# Patient Record
Sex: Male | Born: 1970 | Race: White | Hispanic: No | Marital: Single | Smoking: Current every day smoker
Health system: Southern US, Community
[De-identification: ages and names within clinical notes are randomized; demographics above are authoritative.]

---

## 2021-06-19 ENCOUNTER — Other Ambulatory Visit (HOSPITAL_COMMUNITY)
Admission: RE | Admit: 2021-06-19 | Discharge: 2021-06-19 | Disposition: A | Discharge status: Home / Self Care | Payer: Medicaid Other | Source: Ambulatory Visit | Attending: Family Medicine | Admitting: Family Medicine

## 2021-06-19 ENCOUNTER — Encounter: Payer: Self-pay | Admitting: Family Medicine

## 2021-06-19 ENCOUNTER — Ambulatory Visit (HOSPITAL_COMMUNITY)
Admission: RE | Admit: 2021-06-19 | Discharge: 2021-06-19 | Disposition: A | Discharge status: Home / Self Care | Payer: Medicaid Other | Source: Ambulatory Visit | Attending: Family Medicine | Admitting: Family Medicine

## 2021-06-19 ENCOUNTER — Ambulatory Visit (INDEPENDENT_AMBULATORY_CARE_PROVIDER_SITE_OTHER): Payer: Medicaid Other | Admitting: Family Medicine

## 2021-06-19 VITALS — BP 138/88 | HR 94 | Ht 71.0 in | Wt 160.0 lb

## 2021-06-19 DIAGNOSIS — Z1159 Encounter for screening for other viral diseases: Secondary | ICD-10-CM | POA: Diagnosis not present

## 2021-06-19 DIAGNOSIS — M7542 Impingement syndrome of left shoulder: Secondary | ICD-10-CM | POA: Insufficient documentation

## 2021-06-19 DIAGNOSIS — M62838 Other muscle spasm: Secondary | ICD-10-CM | POA: Diagnosis not present

## 2021-06-19 DIAGNOSIS — Z114 Encounter for screening for human immunodeficiency virus [HIV]: Secondary | ICD-10-CM

## 2021-06-19 DIAGNOSIS — G4709 Other insomnia: Secondary | ICD-10-CM

## 2021-06-19 DIAGNOSIS — M25512 Pain in left shoulder: Secondary | ICD-10-CM | POA: Diagnosis not present

## 2021-06-19 DIAGNOSIS — G8220 Paraplegia, unspecified: Secondary | ICD-10-CM

## 2021-06-19 DIAGNOSIS — E559 Vitamin D deficiency, unspecified: Secondary | ICD-10-CM | POA: Insufficient documentation

## 2021-06-19 DIAGNOSIS — Z720 Tobacco use: Secondary | ICD-10-CM

## 2021-06-19 DIAGNOSIS — G6289 Other specified polyneuropathies: Secondary | ICD-10-CM

## 2021-06-19 DIAGNOSIS — G8929 Other chronic pain: Secondary | ICD-10-CM

## 2021-06-19 DIAGNOSIS — R7301 Impaired fasting glucose: Secondary | ICD-10-CM

## 2021-06-19 LAB — LIPID PANEL
Cholesterol: 284 mg/dL — ABNORMAL HIGH (ref 0–200)
HDL: 58 mg/dL (ref >40)
LDL Cholesterol: 191 mg/dL — ABNORMAL HIGH (ref 0–99)
Total CHOL/HDL Ratio: 4.9 RATIO
Triglycerides: 177 mg/dL — ABNORMAL HIGH (ref <150)
VLDL: 35 mg/dL (ref 0–40)

## 2021-06-19 LAB — CBC WITH DIFFERENTIAL/PLATELET
Abs Immature Granulocytes: 0.06 10*3/uL (ref 0.00–0.07)
Basophils Absolute: 0.1 10*3/uL (ref 0.0–0.1)
Basophils Relative: 1 %
Eosinophils Absolute: 0.3 10*3/uL (ref 0.0–0.5)
Eosinophils Relative: 3 %
HCT: 46.9 % (ref 39.0–52.0)
Hemoglobin: 15.4 g/dL (ref 13.0–17.0)
Immature Granulocytes: 1 %
Lymphocytes Relative: 23 %
Lymphs Abs: 2.5 10*3/uL (ref 0.7–4.0)
MCH: 29.9 pg (ref 26.0–34.0)
MCHC: 32.8 g/dL (ref 30.0–36.0)
MCV: 91.1 fL (ref 80.0–100.0)
Monocytes Absolute: 0.8 10*3/uL (ref 0.1–1.0)
Monocytes Relative: 8 %
Neutro Abs: 6.9 10*3/uL (ref 1.7–7.7)
Neutrophils Relative %: 64 %
Platelets: 457 10*3/uL — ABNORMAL HIGH (ref 150–400)
RBC: 5.15 MIL/uL (ref 4.22–5.81)
RDW: 13.9 % (ref 11.5–15.5)
WBC: 10.6 10*3/uL — ABNORMAL HIGH (ref 4.0–10.5)
nRBC: 0 % (ref 0.0–0.2)

## 2021-06-19 LAB — COMPREHENSIVE METABOLIC PANEL
ALT: 17 U/L (ref 0–44)
AST: 9 U/L — ABNORMAL LOW (ref 15–41)
Albumin: 4.4 g/dL (ref 3.5–5.0)
Alkaline Phosphatase: 69 U/L (ref 38–126)
Anion gap: 8 (ref 5–15)
BUN: 10 mg/dL (ref 6–20)
CO2: 24 mmol/L (ref 22–32)
Calcium: 9.5 mg/dL (ref 8.9–10.3)
Chloride: 107 mmol/L (ref 98–111)
Creatinine, Ser: 0.74 mg/dL (ref 0.61–1.24)
GFR, Estimated: >60 mL/min (ref >60)
Glucose, Bld: 95 mg/dL (ref 70–99)
Potassium: 3.9 mmol/L (ref 3.5–5.1)
Sodium: 139 mmol/L (ref 135–145)
Total Bilirubin: 0.5 mg/dL (ref 0.3–1.2)
Total Protein: 7.7 g/dL (ref 6.5–8.1)

## 2021-06-19 LAB — HEMOGLOBIN A1C
Hgb A1c MFr Bld: 5.1 % (ref 4.8–5.6)
Mean Plasma Glucose: 99.67 mg/dL

## 2021-06-19 LAB — VITAMIN D 25 HYDROXY (VIT D DEFICIENCY, FRACTURES): Vit D, 25-Hydroxy: 24.98 ng/mL — ABNORMAL LOW (ref 30–100)

## 2021-06-19 LAB — T4, FREE: Free T4: 0.9 ng/dL (ref 0.61–1.12)

## 2021-06-19 LAB — HIV ANTIBODY (ROUTINE TESTING W REFLEX): HIV Screen 4th Generation wRfx: NONREACTIVE

## 2021-06-19 LAB — TSH: TSH: 0.505 u[IU]/mL (ref 0.350–4.500)

## 2021-06-19 LAB — HEPATITIS C ANTIBODY: HCV Ab: NONREACTIVE

## 2021-06-19 MED ORDER — TRAZODONE HCL 50 MG PO TABS: 25.0000 mg | ORAL_TABLET | Freq: Every evening | ORAL | 3 refills | Status: AC | PRN

## 2021-06-19 MED ORDER — GABAPENTIN 300 MG PO CAPS
300.0000 mg | ORAL_CAPSULE | Freq: Three times a day (TID) | ORAL | 2 refills | Status: DC
Start: 2021-06-19 — End: 2021-06-23

## 2021-06-19 MED ORDER — CYCLOBENZAPRINE HCL 5 MG PO TABS: 5.0000 mg | ORAL_TABLET | Freq: Three times a day (TID) | ORAL | 1 refills | Status: DC | PRN

## 2021-06-19 MED ORDER — LIDOCAINE 4 % EX PTCH: MEDICATED_PATCH | CUTANEOUS | 2 refills | Status: AC

## 2021-06-19 NOTE — Patient Instructions (Signed)
I appreciate the opportunity to provide care to you today! ? ?  ?Follow up:  3 months ? ?Labs: please stop by the lab during the week to get your blood drawn (CBC, CMP, TSH, Lipid profile, HgA1c, Vit D) ? ?Screening: HIV and Hep C ? ?Please stop by [facility] hospital anytime to get an x-ray of your left shoulders ? ?Please pick up your medications at the pharmacy ? ?Referrals today-  PT ?  ?Please continue to a heart-healthy diet and increase your physical activities. Try to exercise for 56mins at least three times a week.  ? ? ?  ?It was a pleasure to see you and I look forward to continuing to work together on your health and well-being. ?Please do not hesitate to call the office if you need care or have questions about your care. ?  ?Have a wonderful day and week. ?With Gratitude, ?Alvira Monday MSN, FNP-BC  ?
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
## 2021-06-21 DIAGNOSIS — M25512 Pain in left shoulder: Secondary | ICD-10-CM | POA: Insufficient documentation

## 2021-06-21 DIAGNOSIS — G629 Polyneuropathy, unspecified: Secondary | ICD-10-CM | POA: Insufficient documentation

## 2021-06-21 DIAGNOSIS — G8220 Paraplegia, unspecified: Secondary | ICD-10-CM | POA: Insufficient documentation

## 2021-06-21 DIAGNOSIS — Z720 Tobacco use: Secondary | ICD-10-CM | POA: Insufficient documentation

## 2021-06-21 DIAGNOSIS — G4700 Insomnia, unspecified: Secondary | ICD-10-CM | POA: Insufficient documentation

## 2021-06-21 NOTE — Assessment & Plan Note (Signed)
Referral made to PT to resume treatment ?

## 2021-06-21 NOTE — Assessment & Plan Note (Signed)
Patient reports numbness and tingling in his hands and feet ?Gabapentin ordered ?

## 2021-06-21 NOTE — Assessment & Plan Note (Deleted)
Patient reports numbness and tingling in his hands and feet ?Gabapentin ordered ?

## 2021-06-21 NOTE — Assessment & Plan Note (Signed)
Smokes about 1/2 pack/day  Asked about quitting: confirms that he/she currently smokes cigarettes Advise to quit smoking: Educated about QUITTING to reduce the risk of cancer, cardio and cerebrovascular disease. Assess willingness: Unwilling to quit at this time, but is working on cutting back. Assist with counseling and pharmacotherapy: Counseled for 5 minutes and literature provided. Arrange for follow up: follow up in 3 months and continue to offer help. 

## 2021-06-21 NOTE — Assessment & Plan Note (Addendum)
-  X-ray order to assess for shoulder impingement/ rotator cuff tendinitis ?- Lidocaine patch to help with pain ?-Gabapentin order to help with nerve pain ?
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
## 2021-06-21 NOTE — Assessment & Plan Note (Signed)
-  Patient is wheelchair-bound due to weakness in the lower extremities ?-Referral to PT ?

## 2021-06-22 ENCOUNTER — Encounter: Payer: Self-pay | Admitting: Family Medicine

## 2021-06-22 ENCOUNTER — Telehealth: Payer: Self-pay

## 2021-06-22 ENCOUNTER — Telehealth: Payer: Self-pay | Admitting: Family Medicine

## 2021-06-22 ENCOUNTER — Other Ambulatory Visit: Payer: Self-pay | Admitting: Family Medicine

## 2021-06-22 DIAGNOSIS — E785 Hyperlipidemia, unspecified: Secondary | ICD-10-CM

## 2021-06-22 MED ORDER — ROSUVASTATIN CALCIUM 20 MG PO TABS: 20.0000 mg | ORAL_TABLET | Freq: Every day | ORAL | 3 refills | Status: AC

## 2021-06-22 NOTE — Telephone Encounter (Signed)
I've sent you a message regarding his labs.

## 2021-06-22 NOTE — Telephone Encounter (Signed)
Pt states that the previous rx for gabapentin was 2 pills 3x daily. He states that the new rx for gabapentin is 1 pill, 3x a day. He is unable to pick up the new rx on 07/01/2021. He states that phar was supposed to have called [place]. Can you please look into this? ? ? ? ? ?[facility] [address]  ?

## 2021-06-22 NOTE — Telephone Encounter (Signed)
Spoke to pt went over labs 

## 2021-06-22 NOTE — Progress Notes (Signed)
The 10-year ASCVD risk score (Arnett DK, et al., 2019) is: 13.3% ?  Values used to calculate the score: ?    Age: 51 years ?    Sex: Male ?    Is Non-Hispanic African American: No ?    Diabetic: No ?    Tobacco smoker: Yes ?    Systolic Blood Pressure: 138 mmHg ?    Is BP treated: No ?    HDL Cholesterol: 58 mg/dL ?    Total Cholesterol: 284 mg/dL  ?

## 2021-06-22 NOTE — Telephone Encounter (Signed)
Patient returning lab result call 

## 2021-06-22 NOTE — Progress Notes (Signed)
Please inform the patient that I started him on Rosuvastatin for his elevated cholesterol. He can pick up his prescription at the pharmacy. ? ?I also recommend a cholesterol-lowering diet low in fat or saturated fat because her cholesterol were slightly elevated. I would advise her to implement the Mediterranean diet, which emphasizes fruits, vegetables, whole grains, beans, nuts, seeds, and healthy fats. ? ? His Vit. D is slightly low, I can put in orders for Vit D supplements or he can take OTC Vit D 5000iu daily. ? ?All other labs were normal ?

## 2021-06-22 NOTE — Telephone Encounter (Signed)
Tried calling pt back unable to reach him.  ?

## 2021-06-22 NOTE — Telephone Encounter (Signed)
Pt called back for test results. States his phone is messed up and was unable to answer. Please call back when available.  ?

## 2021-06-22 NOTE — Telephone Encounter (Signed)
Patient calling about lab results. 

## 2021-06-22 NOTE — Telephone Encounter (Signed)
Please advice on lab results?  

## 2021-06-23 ENCOUNTER — Other Ambulatory Visit: Payer: Self-pay | Admitting: Family Medicine

## 2021-06-23 ENCOUNTER — Telehealth: Payer: Self-pay

## 2021-06-23 ENCOUNTER — Telehealth: Payer: Self-pay | Admitting: Family Medicine

## 2021-06-23 MED ORDER — GABAPENTIN 300 MG PO CAPS: 600.0000 mg | ORAL_CAPSULE | Freq: Three times a day (TID) | ORAL | 2 refills | Status: AC
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
## 2021-06-23 NOTE — Telephone Encounter (Signed)
Pt would like to have an MRI on his neck if possible.  ?

## 2021-06-23 NOTE — Telephone Encounter (Signed)
Please advice  

## 2021-06-23 NOTE — Telephone Encounter (Signed)
Please call patient has questions about his med and also xray. ?

## 2021-06-23 NOTE — Telephone Encounter (Signed)
Pt called stating that he is wanting to know if a MRI is going to be ordered to check his neck for a pinch nerve since the x-rays showed no breakage??? Please advise  ?

## 2021-06-24 ENCOUNTER — Other Ambulatory Visit: Payer: Self-pay | Admitting: Family Medicine

## 2021-06-24 DIAGNOSIS — M5412 Radiculopathy, cervical region: Secondary | ICD-10-CM

## 2021-06-24 NOTE — Telephone Encounter (Signed)
MRI scheduled on 07/13/21 at 1:00pm. Attempted to contact pt to inform him, unable to leave vm, will attempt at a later time.  ?

## 2021-06-25 ENCOUNTER — Encounter (HOSPITAL_COMMUNITY): Payer: Self-pay

## 2021-06-25 ENCOUNTER — Ambulatory Visit (HOSPITAL_COMMUNITY): Payer: Medicaid Other | Attending: Family Medicine

## 2021-06-25 ENCOUNTER — Encounter (INDEPENDENT_AMBULATORY_CARE_PROVIDER_SITE_OTHER): Payer: Self-pay

## 2021-06-25 ENCOUNTER — Telehealth: Payer: Self-pay

## 2021-06-25 DIAGNOSIS — R262 Difficulty in walking, not elsewhere classified: Secondary | ICD-10-CM | POA: Diagnosis present

## 2021-06-25 DIAGNOSIS — R2689 Other abnormalities of gait and mobility: Secondary | ICD-10-CM | POA: Insufficient documentation

## 2021-06-25 DIAGNOSIS — M6281 Muscle weakness (generalized): Secondary | ICD-10-CM | POA: Insufficient documentation

## 2021-06-25 DIAGNOSIS — G8220 Paraplegia, unspecified: Secondary | ICD-10-CM | POA: Diagnosis present

## 2021-06-25 NOTE — Telephone Encounter (Signed)
Spoke to pt he has been informed of appt date.

## 2021-06-25 NOTE — Telephone Encounter (Signed)
Patient returning call no detail message was left patient said. Call back # [phone].

## 2021-06-25 NOTE — Telephone Encounter (Signed)
Returned call
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
## 2021-06-30 ENCOUNTER — Encounter (HOSPITAL_COMMUNITY): Payer: Self-pay

## 2021-06-30 ENCOUNTER — Ambulatory Visit (HOSPITAL_COMMUNITY): Payer: Medicaid Other

## 2021-06-30 ENCOUNTER — Telehealth: Payer: Self-pay

## 2021-06-30 DIAGNOSIS — M6281 Muscle weakness (generalized): Secondary | ICD-10-CM

## 2021-06-30 DIAGNOSIS — R262 Difficulty in walking, not elsewhere classified: Secondary | ICD-10-CM

## 2021-06-30 DIAGNOSIS — G8220 Paraplegia, unspecified: Secondary | ICD-10-CM | POA: Diagnosis not present

## 2021-06-30 DIAGNOSIS — R2689 Other abnormalities of gait and mobility: Secondary | ICD-10-CM

## 2021-06-30 NOTE — Telephone Encounter (Signed)
Patient called went to physical therapy appointment today for his hands and his hands were so tired could not grip the bar, so therapist told the patient to call his pcp to have them arrange occupation therapy also.
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
## 2021-07-01 ENCOUNTER — Telehealth: Payer: Self-pay | Admitting: Family Medicine

## 2021-07-01 NOTE — Telephone Encounter (Signed)
Pt returning call

## 2021-07-01 NOTE — Telephone Encounter (Signed)
Separate message sent to [place].

## 2021-07-01 NOTE — Telephone Encounter (Signed)
Patient needs an referral for occupational to help patient hold to the bars when patient walks. Please call patient back #[phone] once the referral is sent in.

## 2021-07-02 ENCOUNTER — Other Ambulatory Visit: Payer: Self-pay | Admitting: Family Medicine

## 2021-07-02 ENCOUNTER — Encounter (HOSPITAL_COMMUNITY): Payer: Self-pay

## 2021-07-02 ENCOUNTER — Encounter (INDEPENDENT_AMBULATORY_CARE_PROVIDER_SITE_OTHER): Payer: Self-pay | Admitting: *Deleted

## 2021-07-02 ENCOUNTER — Ambulatory Visit (INDEPENDENT_AMBULATORY_CARE_PROVIDER_SITE_OTHER): Payer: Medicaid Other | Admitting: Internal Medicine

## 2021-07-02 ENCOUNTER — Encounter: Payer: Self-pay | Admitting: Internal Medicine

## 2021-07-02 ENCOUNTER — Ambulatory Visit (HOSPITAL_COMMUNITY): Payer: Medicaid Other

## 2021-07-02 VITALS — BP 138/82 | HR 115 | Resp 18 | Ht 71.0 in

## 2021-07-02 DIAGNOSIS — Z1211 Encounter for screening for malignant neoplasm of colon: Secondary | ICD-10-CM

## 2021-07-02 DIAGNOSIS — G8220 Paraplegia, unspecified: Secondary | ICD-10-CM | POA: Diagnosis not present

## 2021-07-02 DIAGNOSIS — R262 Difficulty in walking, not elsewhere classified: Secondary | ICD-10-CM

## 2021-07-02 DIAGNOSIS — K047 Periapical abscess without sinus: Secondary | ICD-10-CM

## 2021-07-02 DIAGNOSIS — M6281 Muscle weakness (generalized): Secondary | ICD-10-CM

## 2021-07-02 DIAGNOSIS — R2689 Other abnormalities of gait and mobility: Secondary | ICD-10-CM

## 2021-07-02 MED ORDER — AMOXICILLIN-POT CLAVULANATE 875-125 MG PO TABS: 1.0000 | ORAL_TABLET | Freq: Two times a day (BID) | ORAL | 0 refills | Status: AC

## 2021-07-02 MED ORDER — IBUPROFEN 600 MG PO TABS: 600.0000 mg | ORAL_TABLET | Freq: Three times a day (TID) | ORAL | 0 refills | Status: DC | PRN

## 2021-07-02 NOTE — Telephone Encounter (Signed)
Pt informed

## 2021-07-02 NOTE — Progress Notes (Signed)
Acute Office Visit  Subjective:    Patient ID: Erik Ramos, male    DOB: [birth year], 51 y.o.   MRN: [id number]  Chief Complaint  Patient presents with   Dental Pain    Pt started having dental pain 07-02-21 he called to make dental appt but they cannot see him until 5-31 needs to get antibiotic. This is the right side molar he said filling fell out     HPI Patient is in today for complaint of right-sided dental pain since yesterday.  He had dental filling fall off recently in the right lower molar tooth.  He complains of constant right lower dental pain, radiating to right ear.  He denies any local bleeding currently.  He has tried to contact his dentist and has an appointment on 07/08/21.  He denies any fever, chills, ear discharge or oral thrush currently.  History reviewed. No pertinent past medical history.  History reviewed. No pertinent surgical history.  History reviewed. No pertinent family history.  Social History   Socioeconomic History   Marital status: Single    Spouse name: Not on file   Number of children: Not on file   Years of education: Not on file   Highest education level: Not on file  Occupational History   Not on file  Tobacco Use   Smoking status: Every Day    Packs/day: 0.50    Years: 15.00    Pack years: 7.50    Types: Cigarettes   Smokeless tobacco: Not on file  Substance and Sexual Activity   Alcohol use: Not on file   Drug use: Not on file   Sexual activity: Not on file  Other Topics Concern   Not on file  Social History Narrative   Not on file   Social Determinants of Health   Financial Resource Strain: Not on file  Food Insecurity: Not on file  Transportation Needs: Not on file  Physical Activity: Not on file  Stress: Not on file  Social Connections: Not on file  Intimate Partner Violence: Not on file    Outpatient Medications Prior to Visit  Medication Sig Dispense Refill   cyclobenzaprine (FLEXERIL) 5 MG tablet Take 1  tablet (5 mg total) by mouth 3 (three) times daily as needed for muscle spasms. 30 tablet 1   gabapentin (NEURONTIN) 300 MG capsule Take 2 capsules (600 mg total) by mouth 3 (three) times daily. 90 capsule 2   lidocaine 4 % Apply the the left shoulders 3 patch 2   rosuvastatin (CRESTOR) 20 MG tablet Take 1 tablet (20 mg total) by mouth daily. 90 tablet 3   traZODone (DESYREL) 50 MG tablet Take 0.5 tablets (25 mg total) by mouth at bedtime as needed for sleep. 30 tablet 3   No facility-administered medications prior to visit.    Allergies  Allergen Reactions   Shellfish Allergy Anaphylaxis    Review of Systems  Constitutional:  Negative for chills and fever.  HENT:  Positive for dental problem and ear pain. Negative for congestion and sore throat.   Eyes:  Negative for pain and discharge.  Respiratory:  Negative for cough and shortness of breath.   Cardiovascular:  Negative for chest pain and palpitations.  Gastrointestinal:  Negative for nausea and vomiting.  Genitourinary:  Negative for dysuria.  Musculoskeletal:  Negative for neck pain and neck stiffness.  Skin:  Negative for rash.  Neurological:  Negative for dizziness, weakness, numbness and headaches.  Psychiatric/Behavioral:  Negative for agitation  and behavioral problems.       Objective:    Physical Exam Vitals reviewed.  Constitutional:      General: He is not in acute distress.    Appearance: He is not diaphoretic.  HENT:     Head: Normocephalic and atraumatic.     Nose: Nose normal.     Mouth/Throat:     Mouth: Mucous membranes are moist.     Dentition: Dental abscesses (R molar) present.  Eyes:     General: No scleral icterus.    Extraocular Movements: Extraocular movements intact.  Cardiovascular:     Rate and Rhythm: Normal rate and regular rhythm.     Pulses: Normal pulses.     Heart sounds: Normal heart sounds. No murmur heard. Pulmonary:     Breath sounds: Normal breath sounds. No wheezing or rales.   Musculoskeletal:     Cervical back: Neck supple. No tenderness.  Skin:    General: Skin is warm.     Findings: No rash.  Neurological:     General: No focal deficit present.     Mental Status: He is alert and oriented to person, place, and time.  Psychiatric:        Mood and Affect: Mood normal.        Behavior: Behavior normal.    BP 138/82 (BP Location: Left Arm, Patient Position: Sitting, Cuff Size: Normal)   Pulse (!) 115   Resp 18   Ht 5\' 11"  (1.803 m)   SpO2 97%   BMI 22.32 kg/m  Wt Readings from Last 3 Encounters:  06/19/21 160 lb (72.6 kg)        Assessment & Plan:   Problem List Items Addressed This Visit    Visit Diagnoses     Dental abscess    -  Primary Right lower molar dental abscess Started empiric Augmentin Ibuprofen as needed, advised to avoid using BC powder over teeth Follow up with dental surgeon   Relevant Medications   amoxicillin-clavulanate (AUGMENTIN) 875-125 MG tablet   ibuprofen (ADVIL) 600 MG tablet    Meds ordered this encounter  Medications   amoxicillin-clavulanate (AUGMENTIN) 875-125 MG tablet    Sig: Take 1 tablet by mouth 2 (two) times daily.    Dispense:  14 tablet    Refill:  0   ibuprofen (ADVIL) 600 MG tablet    Sig: Take 1 tablet (600 mg total) by mouth every 8 (eight) hours as needed.    Dispense:  30 tablet    Refill:  0     Martesha Niedermeier 08/19/21, MD
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
## 2021-07-09 ENCOUNTER — Encounter (HOSPITAL_COMMUNITY): Payer: Self-pay

## 2021-07-09 ENCOUNTER — Ambulatory Visit (HOSPITAL_COMMUNITY): Payer: Medicaid Other | Attending: Family Medicine

## 2021-07-09 DIAGNOSIS — R262 Difficulty in walking, not elsewhere classified: Secondary | ICD-10-CM | POA: Diagnosis present

## 2021-07-09 DIAGNOSIS — R2689 Other abnormalities of gait and mobility: Secondary | ICD-10-CM | POA: Insufficient documentation

## 2021-07-09 DIAGNOSIS — G8220 Paraplegia, unspecified: Secondary | ICD-10-CM | POA: Diagnosis present

## 2021-07-09 DIAGNOSIS — M6281 Muscle weakness (generalized): Secondary | ICD-10-CM | POA: Insufficient documentation
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
## 2021-07-10 ENCOUNTER — Ambulatory Visit (HOSPITAL_COMMUNITY): Payer: Medicaid Other

## 2021-07-13 ENCOUNTER — Ambulatory Visit (HOSPITAL_COMMUNITY)
Admission: RE | Admit: 2021-07-13 | Discharge: 2021-07-13 | Disposition: A | Discharge status: Home / Self Care | Payer: Medicaid Other | Source: Ambulatory Visit | Attending: Family Medicine | Admitting: Family Medicine

## 2021-07-13 DIAGNOSIS — M5412 Radiculopathy, cervical region: Secondary | ICD-10-CM | POA: Insufficient documentation

## 2021-07-14 ENCOUNTER — Ambulatory Visit (HOSPITAL_COMMUNITY): Payer: Medicaid Other | Attending: Family Medicine

## 2021-07-14 ENCOUNTER — Telehealth: Payer: Self-pay

## 2021-07-14 ENCOUNTER — Encounter (HOSPITAL_COMMUNITY): Payer: Self-pay

## 2021-07-14 DIAGNOSIS — R262 Difficulty in walking, not elsewhere classified: Secondary | ICD-10-CM | POA: Insufficient documentation

## 2021-07-14 DIAGNOSIS — G8220 Paraplegia, unspecified: Secondary | ICD-10-CM | POA: Diagnosis not present

## 2021-07-14 DIAGNOSIS — M25512 Pain in left shoulder: Secondary | ICD-10-CM | POA: Insufficient documentation

## 2021-07-14 DIAGNOSIS — R2689 Other abnormalities of gait and mobility: Secondary | ICD-10-CM

## 2021-07-14 DIAGNOSIS — M6281 Muscle weakness (generalized): Secondary | ICD-10-CM | POA: Diagnosis not present
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
## 2021-07-14 NOTE — Telephone Encounter (Signed)
Patient called asking can he get a prescription sent in for nicotine package to help him quit smoking.  Also calling about his MRI results.  Pharmacy: [facility]

## 2021-07-15 ENCOUNTER — Telehealth: Payer: Self-pay | Admitting: Family Medicine

## 2021-07-15 NOTE — Telephone Encounter (Signed)
Pt calling back in regards to the nicotine patches from yesterday

## 2021-07-15 NOTE — Telephone Encounter (Signed)
Will discuss during office visit.

## 2021-07-16 ENCOUNTER — Ambulatory Visit (HOSPITAL_COMMUNITY): Payer: Medicaid Other | Attending: Family Medicine

## 2021-07-16 ENCOUNTER — Encounter (HOSPITAL_COMMUNITY): Payer: Self-pay

## 2021-07-16 DIAGNOSIS — R2689 Other abnormalities of gait and mobility: Secondary | ICD-10-CM | POA: Diagnosis present

## 2021-07-16 DIAGNOSIS — G8220 Paraplegia, unspecified: Secondary | ICD-10-CM | POA: Insufficient documentation

## 2021-07-16 DIAGNOSIS — M6281 Muscle weakness (generalized): Secondary | ICD-10-CM | POA: Insufficient documentation

## 2021-07-16 DIAGNOSIS — R262 Difficulty in walking, not elsewhere classified: Secondary | ICD-10-CM | POA: Insufficient documentation
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
## 2021-07-16 NOTE — Telephone Encounter (Signed)
Pt advised with verbal understanding  °

## 2021-07-21 ENCOUNTER — Encounter (HOSPITAL_COMMUNITY): Payer: Self-pay

## 2021-07-21 ENCOUNTER — Ambulatory Visit (HOSPITAL_COMMUNITY): Payer: Medicaid Other | Attending: Family Medicine

## 2021-07-21 DIAGNOSIS — M79672 Pain in left foot: Secondary | ICD-10-CM | POA: Diagnosis not present

## 2021-07-21 DIAGNOSIS — M79642 Pain in left hand: Secondary | ICD-10-CM | POA: Diagnosis not present

## 2021-07-21 DIAGNOSIS — M79671 Pain in right foot: Secondary | ICD-10-CM | POA: Insufficient documentation

## 2021-07-21 DIAGNOSIS — Z905 Acquired absence of kidney: Secondary | ICD-10-CM | POA: Insufficient documentation

## 2021-07-21 DIAGNOSIS — G8220 Paraplegia, unspecified: Secondary | ICD-10-CM | POA: Insufficient documentation

## 2021-07-21 DIAGNOSIS — M79641 Pain in right hand: Secondary | ICD-10-CM | POA: Diagnosis not present

## 2021-07-21 DIAGNOSIS — R2689 Other abnormalities of gait and mobility: Secondary | ICD-10-CM

## 2021-07-21 DIAGNOSIS — M25512 Pain in left shoulder: Secondary | ICD-10-CM | POA: Diagnosis not present

## 2021-07-21 DIAGNOSIS — R262 Difficulty in walking, not elsewhere classified: Secondary | ICD-10-CM

## 2021-07-21 DIAGNOSIS — M6281 Muscle weakness (generalized): Secondary | ICD-10-CM
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
## 2021-07-22 ENCOUNTER — Telehealth: Payer: Self-pay

## 2021-07-22 NOTE — Telephone Encounter (Signed)
Patient came by office asked if nurse will call him about his MRI results. [phone].

## 2021-07-23 ENCOUNTER — Ambulatory Visit (HOSPITAL_COMMUNITY): Payer: Medicaid Other

## 2021-07-23 ENCOUNTER — Encounter (HOSPITAL_COMMUNITY): Payer: Self-pay

## 2021-07-23 NOTE — Telephone Encounter (Signed)
Please advice on results?  ?
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
## 2021-07-23 NOTE — Telephone Encounter (Signed)
We inform the patient that we dicussed his MRI result at his appt with me
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
## 2021-07-24 ENCOUNTER — Telehealth: Payer: Self-pay

## 2021-07-24 NOTE — Telephone Encounter (Signed)
Pt called states he moved out of town for good, moved in with his aunt will be looking for new primary care in that area, wants his MRI results.

## 2021-07-24 NOTE — Telephone Encounter (Signed)
Return call
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
## 2021-07-28 ENCOUNTER — Telehealth: Payer: Self-pay

## 2021-07-28 ENCOUNTER — Ambulatory Visit (HOSPITAL_COMMUNITY): Payer: Medicaid Other

## 2021-07-28 NOTE — Telephone Encounter (Signed)
Patient called in for MRI results.

## 2021-07-28 NOTE — Telephone Encounter (Signed)
Inform the patient that his MRI shows normal alignment with no evidence of prior fracture or focal bone lesion
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
## 2021-07-29 ENCOUNTER — Telehealth: Payer: Self-pay

## 2021-07-29 ENCOUNTER — Telehealth: Payer: Self-pay | Admitting: Family Medicine

## 2021-07-29 NOTE — Telephone Encounter (Signed)
Mri results mailed.

## 2021-07-29 NOTE — Telephone Encounter (Signed)
Pt informed of MRI results.

## 2021-07-29 NOTE — Telephone Encounter (Signed)
Pt returned call

## 2021-07-29 NOTE — Telephone Encounter (Signed)
Noted, mri results mailed out.

## 2021-07-29 NOTE — Telephone Encounter (Signed)
Spoke to pt he would like MRI results to be faxed or mailed he will be calling back with that information since he didn't have it on hand.

## 2021-07-29 NOTE — Telephone Encounter (Signed)
Patient called gave new address to mail his MRI report.  Updated in epic.  [address] [place] [address]

## 2021-07-30 ENCOUNTER — Ambulatory Visit (HOSPITAL_COMMUNITY): Payer: Medicaid Other

## 2021-07-31 ENCOUNTER — Encounter (INDEPENDENT_AMBULATORY_CARE_PROVIDER_SITE_OTHER): Payer: Self-pay

## 2021-08-03 ENCOUNTER — Other Ambulatory Visit: Payer: Self-pay

## 2021-08-03 ENCOUNTER — Telehealth: Payer: Self-pay | Admitting: Family Medicine

## 2021-08-03 DIAGNOSIS — K047 Periapical abscess without sinus: Secondary | ICD-10-CM

## 2021-08-03 DIAGNOSIS — M62838 Other muscle spasm: Secondary | ICD-10-CM

## 2021-08-03 MED ORDER — IBUPROFEN 600 MG PO TABS: 600.0000 mg | ORAL_TABLET | Freq: Three times a day (TID) | ORAL | 0 refills | Status: AC | PRN

## 2021-08-03 MED ORDER — CYCLOBENZAPRINE HCL 5 MG PO TABS: 5.0000 mg | ORAL_TABLET | Freq: Three times a day (TID) | ORAL | 0 refills | Status: AC | PRN

## 2021-08-04 ENCOUNTER — Ambulatory Visit (HOSPITAL_COMMUNITY): Payer: Medicaid Other

## 2021-08-06 ENCOUNTER — Ambulatory Visit (HOSPITAL_COMMUNITY): Payer: Medicaid Other

## 2021-08-07 ENCOUNTER — Encounter (HOSPITAL_COMMUNITY): Payer: Medicaid Other | Admitting: Occupational Therapy

## 2021-08-07 ENCOUNTER — Ambulatory Visit (HOSPITAL_COMMUNITY): Payer: Medicaid Other | Admitting: Occupational Therapy

## 2021-08-12 ENCOUNTER — Ambulatory Visit (HOSPITAL_COMMUNITY): Payer: Medicaid Other | Admitting: Occupational Therapy

## 2021-09-10 ENCOUNTER — Other Ambulatory Visit: Payer: Self-pay | Admitting: Family Medicine

## 2021-09-10 DIAGNOSIS — K047 Periapical abscess without sinus: Secondary | ICD-10-CM

## 2021-09-21 ENCOUNTER — Ambulatory Visit: Payer: Medicaid Other | Admitting: Family Medicine

## 2021-12-18 ENCOUNTER — Encounter (INDEPENDENT_AMBULATORY_CARE_PROVIDER_SITE_OTHER): Payer: Self-pay | Admitting: *Deleted

## 2021-12-18 NOTE — Telephone Encounter (Signed)
This encounter was created in error - please disregard.
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
